# Patient Record
Sex: Female | Born: 2000 | Race: White | Hispanic: Yes | Marital: Single | Smoking: Never smoker
Health system: Southern US, Community
[De-identification: ages and names within clinical notes are randomized; demographics above are authoritative.]

## PROBLEM LIST (undated history)

## (undated) DIAGNOSIS — G43909 Migraine, unspecified, not intractable, without status migrainosus: Secondary | ICD-10-CM

## (undated) HISTORY — PX: TONSILLECTOMY: SUR1361

## (undated) HISTORY — DX: Migraine, unspecified, not intractable, without status migrainosus: G43.909

---
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
## 2019-06-19 ENCOUNTER — Other Ambulatory Visit: Payer: Self-pay

## 2019-06-19 ENCOUNTER — Ambulatory Visit (LOCAL_COMMUNITY_HEALTH_CENTER): Payer: Medicaid Other | Admitting: Nurse Practitioner

## 2019-06-19 VITALS — BP 102/69 | Ht 60.0 in | Wt 117.0 lb

## 2019-06-19 DIAGNOSIS — Z3009 Encounter for other general counseling and advice on contraception: Secondary | ICD-10-CM

## 2019-06-19 DIAGNOSIS — N760 Acute vaginitis: Secondary | ICD-10-CM | POA: Diagnosis not present

## 2019-06-19 DIAGNOSIS — B9689 Other specified bacterial agents as the cause of diseases classified elsewhere: Secondary | ICD-10-CM

## 2019-06-19 DIAGNOSIS — Z113 Encounter for screening for infections with a predominantly sexual mode of transmission: Secondary | ICD-10-CM

## 2019-06-19 DIAGNOSIS — Z30431 Encounter for routine checking of intrauterine contraceptive device: Secondary | ICD-10-CM

## 2019-06-19 LAB — WET PREP FOR TRICH, YEAST, CLUE
Trichomonas Exam: NEGATIVE
Yeast Exam: NEGATIVE

## 2019-06-19 MED ORDER — MULTIVITAMINS PO CAPS: 1.0000 | ORAL_CAPSULE | Freq: Every day | ORAL | 0 refills | Status: AC

## 2019-06-19 MED ORDER — METRONIDAZOLE 500 MG PO TABS: 500.0000 mg | ORAL_TABLET | Freq: Two times a day (BID) | ORAL | 0 refills | Status: AC

## 2019-06-19 NOTE — Progress Notes (Signed)
WH problem visit - I[facility] check and STD testing [facility]  Subjective:  Monique Riley is a 18 y.o. being seen today for   Chief Complaint  Patient presents with  . Gynecologic Exam    IUD String check and STD testing    Client into clinic for STD testing and IUD check Denies any additional significant medical history     Does the patient have a current or past history of drug use? No   No components found for: HCV]   Health Maintenance Due  Topic Date Due  . TETANUS/TDAP  06/17/2019  . INFLUENZA VACCINE  05/31/2019    Review of Systems  All other systems reviewed and are negative.   The following portions of the patient's history were reviewed and updated as appropriate: allergies, current medications, past family history, past medical history, past social history, past surgical history and problem list. Problem list updated.   See flowsheet for other program required questions.  Objective:   Vitals:   06/19/19 0924  BP: 102/69  Weight: 117 lb (53.1 kg)  Height: 5' (1.524 m)    Physical Exam Vitals signs reviewed.  Constitutional:      Appearance: Normal appearance. She is well-developed and normal weight.  Pulmonary:     Effort: Pulmonary effort is normal.  Genitourinary:    General: Normal vulva.     Exam position: Lithotomy position.     Vagina: Normal.     Cervix: Normal.     Uterus: Normal.      Comments: Client c/o slightly foul odor >4.5 ph Moderate amt of clear to white discharge noted  Visualized IUD strings and able to palpate with bimanual exam.  Skin:    General: Skin is warm and dry.  Neurological:     Mental Status: She is alert.  Psychiatric:        Behavior: Behavior is cooperative.       Assessment and Plan:  Monique Riley is a 18 y.o. female [facility] Women's Health problem visit  1. Encounter for routine  checking of intrauterine contraceptive device (IUD) Skyla IUD check - string visualized during cervical check and palpated with bimanual exam.  2. Screening examination for STD (sexually transmitted disease) Await results - WET PREP FOR TRICH, YEAST, CLUE - please treat wet mount per standing order 3. Bacterial vaginosis Please treat client for BV per standing order  - metroNIDAZOLE (FLAGYL) 500 MG tablet; Take 1 tablet (500 mg total) by mouth 2 (two) times daily for 7 days.  Dispense: 14 tablet; Refill: 0  4. Family planning services Plan to follow up for yearly physical exam  - Multiple Vitamin (MULTIVITAMIN) capsule; Take 1 capsule by mouth daily.  Dispense: 100 capsule; Refill: 0  Client asked the following questions for STD screening:  Allergies: NKDA Previous Surgeries: denies SMH - denies Medications - denies Contact to STD - denies Antibiotics in last 2 wks  - denies Last HIV test - unsure Prior STD - denies Immunizations UTD - unsure Last sex  - 06/09/2019 X last 2 wks  Partners Last 2 months  - 1 Type of sex - vaginal Smoke  - none ETOH - denies IVD  - denies MJ use - denies Travel in last 3 months - denies Abuse hx - denies LMP - 05/21/2019 Last pap - N/A Douche - denies G 0 P    BCM -  Skyla IUD      Return for PRN.  No future appointments.  Berniece Andreas, NP

## 2019-06-19 NOTE — Patient Instructions (Signed)

## 2019-06-19 NOTE — Progress Notes (Signed)
Here today for IUD string check and STD testing. Hal Morales, RN Wet prep results reviewed. Patient treated for BV per provider orders. Hal Morales, RN

## 2019-06-20 ENCOUNTER — Encounter: Payer: Self-pay | Admitting: Nurse Practitioner

## 2019-08-10 ENCOUNTER — Other Ambulatory Visit: Payer: Self-pay

## 2019-08-10 ENCOUNTER — Emergency Department
Admission: EM | Admit: 2019-08-10 | Discharge: 2019-08-10 | Disposition: A | Discharge status: Home / Self Care | Payer: Medicaid Other | Attending: Emergency Medicine | Admitting: Emergency Medicine

## 2019-08-10 ENCOUNTER — Emergency Department: Payer: Medicaid Other

## 2019-08-10 ENCOUNTER — Ambulatory Visit
Admission: EM | Admit: 2019-08-10 | Discharge: 2019-08-10 | Disposition: A | Discharge status: Home / Self Care | Payer: No Typology Code available for payment source | Source: Ambulatory Visit | Attending: Emergency Medicine | Admitting: Emergency Medicine

## 2019-08-10 DIAGNOSIS — S60221A Contusion of right hand, initial encounter: Secondary | ICD-10-CM | POA: Insufficient documentation

## 2019-08-10 DIAGNOSIS — Y9389 Activity, other specified: Secondary | ICD-10-CM | POA: Insufficient documentation

## 2019-08-10 DIAGNOSIS — Z0441 Encounter for examination and observation following alleged adult rape: Secondary | ICD-10-CM | POA: Diagnosis present

## 2019-08-10 DIAGNOSIS — Y999 Unspecified external cause status: Secondary | ICD-10-CM | POA: Insufficient documentation

## 2019-08-10 DIAGNOSIS — R519 Headache, unspecified: Secondary | ICD-10-CM | POA: Insufficient documentation

## 2019-08-10 DIAGNOSIS — Y929 Unspecified place or not applicable: Secondary | ICD-10-CM | POA: Insufficient documentation

## 2019-08-10 DIAGNOSIS — S60222A Contusion of left hand, initial encounter: Secondary | ICD-10-CM | POA: Insufficient documentation

## 2019-08-10 DIAGNOSIS — Z79899 Other long term (current) drug therapy: Secondary | ICD-10-CM | POA: Insufficient documentation

## 2019-08-10 DIAGNOSIS — Z3202 Encounter for pregnancy test, result negative: Secondary | ICD-10-CM | POA: Insufficient documentation

## 2019-08-10 LAB — URINALYSIS, COMPLETE (UACMP) WITH MICROSCOPIC
Bilirubin Urine: NEGATIVE
Glucose, UA: NEGATIVE mg/dL
Ketones, ur: 5 mg/dL — AB
Leukocytes,Ua: NEGATIVE
Nitrite: NEGATIVE
Protein, ur: 30 mg/dL — AB
Specific Gravity, Urine: 1.023 (ref 1.005–1.030)
Squamous Epithelial / LPF: NONE SEEN (ref 0–5)
pH: 6 (ref 5.0–8.0)

## 2019-08-10 LAB — POCT PREGNANCY, URINE: Preg Test, Ur: NEGATIVE

## 2019-08-10 MED ORDER — AZITHROMYCIN 500 MG PO TABS
ORAL_TABLET | ORAL | Status: AC
  Administered 2019-08-10: 1000 mg via ORAL
  Filled 2019-08-10: qty 2

## 2019-08-10 MED ORDER — LIDOCAINE HCL (PF) 1 % IJ SOLN
INTRAMUSCULAR | Status: AC
  Administered 2019-08-10: 0.9 mL
  Filled 2019-08-10: qty 5

## 2019-08-10 MED ORDER — PROMETHAZINE HCL 25 MG PO TABS
25.0000 mg | ORAL_TABLET | Freq: Four times a day (QID) | ORAL | Status: DC | PRN
  Administered 2019-08-10: 25 mg via ORAL
  Filled 2019-08-10: qty 1

## 2019-08-10 MED ORDER — LIDOCAINE HCL (PF) 1 % IJ SOLN
0.9000 mL | Freq: Once | INTRAMUSCULAR | Status: AC
  Administered 2019-08-10: 14:00:00 0.9 mL

## 2019-08-10 MED ORDER — CEFTRIAXONE SODIUM 250 MG IJ SOLR
250.0000 mg | Freq: Once | INTRAMUSCULAR | Status: AC
  Administered 2019-08-10: 14:00:00 250 mg via INTRAMUSCULAR
  Filled 2019-08-10: qty 250

## 2019-08-10 MED ORDER — IBUPROFEN 600 MG PO TABS
600.0000 mg | ORAL_TABLET | Freq: Once | ORAL | Status: AC
  Administered 2019-08-10: 600 mg via ORAL
  Filled 2019-08-10: qty 1

## 2019-08-10 MED ORDER — AZITHROMYCIN 500 MG PO TABS
1000.0000 mg | ORAL_TABLET | Freq: Once | ORAL | Status: AC
  Administered 2019-08-10: 14:00:00 1000 mg via ORAL

## 2019-08-10 MED ORDER — METRONIDAZOLE 500 MG PO TABS
2000.0000 mg | ORAL_TABLET | Freq: Once | ORAL | Status: AC
  Administered 2019-08-10: 2000 mg via ORAL
  Filled 2019-08-10: qty 4
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
## 2019-08-10 NOTE — ED Notes (Signed)
EDP and this RN in room to examine patient, Dr. Archie Balboa discussed with patient about SANE coming to see her and take some samples and get XR of hands.
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
## 2019-08-10 NOTE — ED Notes (Signed)
Called Sane  Dr. Archie Balboa spoke to Bell  [phone]

## 2019-08-10 NOTE — ED Notes (Signed)
Printed and had pt sign for discharge d/t signature pad in room not working

## 2019-08-10 NOTE — ED Notes (Signed)
AC SD in room at this time speaking with patient.

## 2019-08-10 NOTE — Discharge Instructions (Signed)
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 to your pediatrician regarding the use of this medicine in children. Special care may be needed. This medicine should not be given to infants and children younger than 48 years old. Overdosage: If you think you have taken too much of this medicine contact a poison control center or emergency room at once. NOTE: This medicine is only for you. Do not share this medicine with others. What if I miss a dose? If you miss a dose, take it as soon as you can. If it is almost time for your next dose, take only that dose. Do not take double or extra doses. What may interact with this medicine? Do not take this medicine with any of the following  medications:  cisapride  dronedarone  MAOIs like Carbex, Eldepryl, Marplan, Nardil, Parnate  pimozide  quinidine, including dextromethorphan; quinidine  thioridazine This medicine may also interact with the following medications:  certain medicines for depression, anxiety, or psychotic disturbances  certain medicines for anxiety or sleep  certain medicines for seizures like carbamazepine, phenobarbital, phenytoin  certain medicines for movement abnormalities as in Parkinson's disease, or for gastrointestinal problems  epinephrine  medicines for allergies or colds  muscle relaxants  narcotic medicines for pain  other medicines that prolong the QT interval (cause an abnormal heart rhythm) like dofetilide, ziprasidone  tramadol  trimethobenzamide This list may not describe all possible interactions. Give your health care provider a list of all the medicines, herbs, non-prescription drugs, or dietary supplements you use. Also tell them if you smoke, drink alcohol, or use illegal drugs. Some items may interact with your medicine. What should I watch for while using this medicine? Tell your doctor or health care professional if your symptoms do not start to get better in 1 to 2 days. You may get drowsy or dizzy. Do not drive, use machinery, or do anything that needs mental alertness until you know how this medicine affects you. To reduce the risk of dizzy or fainting spells, do not stand or sit up quickly, especially if you are an older patient. Alcohol may increase dizziness and drowsiness. Avoid alcoholic drinks. Your mouth may get dry. Chewing sugarless gum or sucking hard candy, and drinking plenty of water may help. Contact your doctor if the problem does not go away or is severe. This medicine may cause dry eyes and blurred vision. If you wear contact lenses you may feel some discomfort. Lubricating drops may help. See your eye doctor if the problem does not go away or is  severe. This medicine can make you more sensitive to the sun. Keep out of the sun. If you cannot avoid being in the sun, wear protective clothing and use sunscreen. Do not use sun lamps or tanning beds/booths. If you are diabetic, check your blood-sugar levels regularly. What side effects may I notice from receiving this medicine? Side effects that you should report to your doctor or health care professional as soon as possible:  blurred vision  irregular heartbeat, palpitations or chest pain  muscle or facial twitches  pain or difficulty passing urine  seizures  skin rash  slowed or shallow breathing  unusual bleeding or bruising  yellowing of the eyes or skin Side effects that usually do not require medical attention (report to your doctor or health care professional if they continue or are bothersome):  headache  nightmares, agitation, nervousness, excitability, not able to sleep (these are more likely  in children)  stuffy nose This list may not describe all possible side effects. Call your doctor for medical advice about side effects. You may report side effects to FDA at 1-800-FDA-1088. Where should I keep my medicine? Keep out of the reach of children. Store at room temperature, between 20 and 25 degrees C (68 and 77 degrees F). Protect from light. Throw away any unused medicine after the expiration date. NOTE: This sheet is a summary. It may not cover all possible information. If you have questions about this medicine, talk to your doctor, pharmacist, or health care provider.  2020 Elsevier/Gold Standard (2018-10-08 08:46:17)
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
## 2019-08-10 NOTE — ED Notes (Signed)
Medications pulled for SANE nurse

## 2019-08-10 NOTE — ED Notes (Signed)
Pt refusing covid swab.  MD aware
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
## 2019-08-10 NOTE — ED Notes (Signed)
Pt given cup of water, informed her SANE is enroute to talk with her.
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
## 2019-08-10 NOTE — ED Notes (Signed)
Boyfriend back at bedside.
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
## 2019-09-11 ENCOUNTER — Encounter: Payer: Self-pay | Admitting: Advanced Practice Midwife

## 2019-09-11 ENCOUNTER — Ambulatory Visit: Payer: Medicaid Other | Admitting: Advanced Practice Midwife

## 2019-09-11 ENCOUNTER — Other Ambulatory Visit: Payer: Self-pay

## 2019-09-11 DIAGNOSIS — Z113 Encounter for screening for infections with a predominantly sexual mode of transmission: Secondary | ICD-10-CM

## 2019-09-11 DIAGNOSIS — Z62810 Personal history of physical and sexual abuse in childhood: Secondary | ICD-10-CM

## 2019-09-11 DIAGNOSIS — T7421XD Adult sexual abuse, confirmed, subsequent encounter: Secondary | ICD-10-CM

## 2019-09-11 DIAGNOSIS — T7421XA Adult sexual abuse, confirmed, initial encounter: Secondary | ICD-10-CM | POA: Insufficient documentation

## 2019-09-11 LAB — WET PREP FOR TRICH, YEAST, CLUE
Trichomonas Exam: NEGATIVE
Yeast Exam: NEGATIVE

## 2019-09-11 NOTE — Progress Notes (Signed)
Wet mount results reviewed with provider. No treatment needed today.Jenetta Downer, RN
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
## 2020-07-22 ENCOUNTER — Encounter: Payer: Self-pay | Admitting: Advanced Practice Midwife

## 2020-07-22 ENCOUNTER — Other Ambulatory Visit: Payer: Self-pay

## 2020-07-22 ENCOUNTER — Ambulatory Visit (LOCAL_COMMUNITY_HEALTH_CENTER): Payer: Medicaid Other | Admitting: Advanced Practice Midwife

## 2020-07-22 VITALS — BP 111/69 | Ht 60.0 in | Wt 115.0 lb

## 2020-07-22 DIAGNOSIS — F4310 Post-traumatic stress disorder, unspecified: Secondary | ICD-10-CM | POA: Diagnosis not present

## 2020-07-22 DIAGNOSIS — B9689 Other specified bacterial agents as the cause of diseases classified elsewhere: Secondary | ICD-10-CM

## 2020-07-22 DIAGNOSIS — Z3009 Encounter for other general counseling and advice on contraception: Secondary | ICD-10-CM

## 2020-07-22 DIAGNOSIS — F329 Major depressive disorder, single episode, unspecified: Secondary | ICD-10-CM

## 2020-07-22 DIAGNOSIS — N760 Acute vaginitis: Secondary | ICD-10-CM

## 2020-07-22 DIAGNOSIS — F32A Depression, unspecified: Secondary | ICD-10-CM | POA: Insufficient documentation

## 2020-07-22 DIAGNOSIS — Z30431 Encounter for routine checking of intrauterine contraceptive device: Secondary | ICD-10-CM | POA: Diagnosis not present

## 2020-07-22 LAB — WET PREP FOR TRICH, YEAST, CLUE
Trichomonas Exam: NEGATIVE
Yeast Exam: NEGATIVE

## 2020-07-22 MED ORDER — METRONIDAZOLE 500 MG PO TABS: 500.0000 mg | ORAL_TABLET | Freq: Two times a day (BID) | ORAL | 0 refills | Status: AC

## 2020-07-22 NOTE — Progress Notes (Signed)
Pt is here for physical. Pt has a Skyla IUD. Pt reports has just noticed some sharp pain that comes in goes in her lower abdomen a couple of times a month, but is satisfied with the IUD as her BCM. Pt reports having really long periods with spotting afterwards. Pt reports LMP started 07/07/2020 with consistent spotting since then. Pt desires STD screening as she has had some vaginal discharge. Pt filling out PHQ9.
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
## 2020-07-23 NOTE — Progress Notes (Signed)
Wet mount reviewed and pt treated for BV per standing order and per Hazle Coca, CNM verbal order. Kathreen Cosier, LCSW and [facility] cards with contact info given to pt per pt request and per provider order. Counseled pt per provider orders and pt states understanding. Provider orders completed.

## 2021-01-07 ENCOUNTER — Ambulatory Visit (LOCAL_COMMUNITY_HEALTH_CENTER): Payer: Medicaid Other | Admitting: Family Medicine

## 2021-01-07 ENCOUNTER — Ambulatory Visit: Payer: Medicaid Other

## 2021-01-07 ENCOUNTER — Other Ambulatory Visit: Payer: Self-pay

## 2021-01-07 ENCOUNTER — Encounter: Payer: Self-pay | Admitting: Family Medicine

## 2021-01-07 VITALS — BP 111/65 | HR 69 | Ht 60.0 in | Wt 120.6 lb

## 2021-01-07 DIAGNOSIS — Z3009 Encounter for other general counseling and advice on contraception: Secondary | ICD-10-CM | POA: Diagnosis not present

## 2021-01-07 DIAGNOSIS — B9689 Other specified bacterial agents as the cause of diseases classified elsewhere: Secondary | ICD-10-CM

## 2021-01-07 DIAGNOSIS — Z01419 Encounter for gynecological examination (general) (routine) without abnormal findings: Secondary | ICD-10-CM | POA: Diagnosis not present

## 2021-01-07 DIAGNOSIS — Z113 Encounter for screening for infections with a predominantly sexual mode of transmission: Secondary | ICD-10-CM

## 2021-01-07 LAB — WET PREP FOR TRICH, YEAST, CLUE
Trichomonas Exam: NEGATIVE
Yeast Exam: NEGATIVE

## 2021-01-07 MED ORDER — METRONIDAZOLE 500 MG PO TABS: 500.0000 mg | ORAL_TABLET | Freq: Two times a day (BID) | ORAL | 0 refills | Status: AC

## 2021-01-07 NOTE — Progress Notes (Signed)
Presents for evaluation of lower abd pain with persistent vaginal bleeding. Reports above began beginning of 2022. Reports long menses in 2021, but would be a break in between bleeding. Declined Covid vaccine. Jossie Ng, RN Wet mount reviewed by Elveria Rising FNP-BC and treated per her order. Jossie Ng, RN

## 2021-01-07 NOTE — Progress Notes (Signed)
   [facility] problem visit  [facility]  Subjective:  Monique Riley is a 20 y.o. being seen today for abdominal/ IUD pain.    Chief Complaint  Patient presents with  . IUD evaluation    HPI   Does the patient have a current or past history of drug use? No   No components found for: HCV]   Health Maintenance Due  Topic Date Due  . Hepatitis C Screening  Never done  . COVID-19 Vaccine (1) Never done  . HPV VACCINES (1 - 2-dose series) Never done  . CHLAMYDIA SCREENING  Never done  . TETANUS/TDAP  Never done  . INFLUENZA VACCINE  Never done    ROS  The following portions of the patient's history were reviewed and updated as appropriate: allergies, current medications, past family history, past medical history, past social history, past surgical history and problem list. Problem list updated.   See flowsheet for other program required questions.  Objective:   Vitals:   01/07/21 1423  BP: 111/65  Pulse: 69  Weight: 120 lb 9.6 oz (54.7 kg)  Height: 5' (1.524 m)    Physical Exam Constitutional:      Appearance: Normal appearance.  Pulmonary:     Effort: Pulmonary effort is normal.  Genitourinary:    General: Normal vulva.     Rectum: Normal.     Comments: External genitalia without, lice, nits, erythema, edema , lesions or inguinal adenopathy. Vagina with normal mucosa and discharge and pH > 4.  Cervix without visual lesions, uterus firm, mobile, slightly tender on anterior palpation, no masses, CMT adnexal fullness or tenderness.   IUD strings visualized.   Skin:    General: Skin is warm and dry.  Neurological:     Mental Status: She is alert and oriented to person, place, and time.  Psychiatric:        Mood and Affect: Mood normal.        Behavior: Behavior normal.       Assessment and Plan:  Monique Riley is a 20 y.o. female presenting to the [facility] Department for a Women's Health  problem visit  1. Visit for pelvic exam Patient in clinic today reporting having lower abdominal pain x 2 weeks with intermittent vaginal bleeding.  Patient IUD was placed 2 years ago.  Last sex was 11/15/2020.  Reports that partner is only partner.   Patient is considering removal of IUD d/t mother telling patient that IUD was embedded.  Discussed with patient after pelvic exam, testing for infection and trying round of Ibuprofen 800 mg TID x 5 days and re-evaluate need for IUD removal.    2. Screening examination for venereal disease Patient screened for GC/CT and wet prep to rule out infection.  - Chlamydia/Gonorrhea [facility] - WET PREP FOR TRICH, YEAST, CLUE  3. BV (bacterial vaginosis) Wet prep + for amine & clue.  Instructional information for BV prevention given.    RN: treat patient for + BV  - metroNIDAZOLE (FLAGYL) 500 MG tablet; Take 1 tablet (500 mg total) by mouth 2 (two) times daily for 7 days.  Dispense: 14 tablet; Refill: 0     Return in about 10 days (around 01/17/2021) for as needed.  No future appointments.  Wendi Snipes, FNP

## 2021-01-07 NOTE — Patient Instructions (Signed)
Bacterial Vaginosis  Bacterial vaginosis is an infection that occurs when the normal balance of bacteria in the vagina changes. This change is caused by an overgrowth of certain bacteria in the vagina. Bacterial vaginosis is the most common vaginal infection among females aged 21 to 44 years. This condition increases the risk of sexually transmitted infections (STIs). Treatment can help reduce this risk. Treatment is very important for pregnant women because this condition can cause babies to be born early (prematurely) or at a low birth weight. What are the causes? This condition is caused by an increase in harmful bacteria that are normally present in small amounts in the vagina. However, the exact reason this condition develops is not known. You cannot get bacterial vaginosis from toilet seats, bedding, swimming pools, or contact with objects around you. What increases the risk? The following factors may make you more likely to develop this condition:  Having a new sexual partner or multiple sexual partners, or having unprotected sex.  Douching.  Having an intrauterine device (IUD).  Smoking.  Abusing drugs and alcohol. This may lead to riskier sexual behavior.  Taking certain antibiotic medicines.  Being pregnant. What are the signs or symptoms? Some women with this condition have no symptoms. Symptoms may include:  Gray or white vaginal discharge. The discharge can be watery or foamy.  A fish-like odor with discharge, especially after sex or during menstruation.  Itching in and around the vagina.  Burning or pain with urination. How is this diagnosed? This condition is diagnosed based on:  Your medical history.  A physical exam of the vagina.  Checking a sample of vaginal fluid for harmful bacteria or abnormal cells. How is this treated? This condition is treated with antibiotic medicines. These may be given as a pill, a vaginal cream, or a medicine that is put into the  vagina (suppository). If the condition comes back after treatment, a second round of antibiotics may be needed. Follow these instructions at home: Medicines  Take or apply over-the-counter and prescription medicines only as told by your health care provider.  Take or apply your antibiotic medicine as told by your health care provider. Do not stop using the antibiotic even if you start to feel better. General instructions  If you have a female sexual partner, tell her that you have a vaginal infection. She should follow up with her health care provider. If you have a female sexual partner, he does not need treatment.  Avoid sexual activity until you finish treatment.  Drink enough fluid to keep your urine pale yellow.  Keep the area around your vagina and rectum clean. ? Wash the area daily with warm water. ? Wipe yourself from front to back after using the toilet.  If you are breastfeeding, talk to your health care provider about continuing breastfeeding during treatment.  Keep all follow-up visits. This is important. How is this prevented? Self-care  Do not douche.  Wash the outside of your vagina with warm water only.  Wear cotton or cotton-lined underwear.  Avoid wearing tight pants and pantyhose, especially during the summer. Safe sex  Use protection when having sex. This includes: ? Using condoms. ? Using dental dams. This is a thin layer of a material made of latex or polyurethane that protects the mouth during oral sex.  Limit the number of sexual partners. To help prevent bacterial vaginosis, it is best to have sex with just one partner (monogamous relationship).  Make sure you and your sexual partner   are tested for STIs. Drugs and alcohol  Do not use any products that contain nicotine or tobacco. These products include cigarettes, chewing tobacco, and vaping devices, such as e-cigarettes. If you need help quitting, ask your health care provider.  Do not use  drugs.  Do not drink alcohol if: ? Your health care provider tells you not to do this. ? You are pregnant, may be pregnant, or are planning to become pregnant.  If you drink alcohol: ? Limit how much you have to 0-1 drink a day. ? Be aware of how much alcohol is in your drink. In the [place] one drink equals one 12 oz bottle of beer (355 mL), one 5 oz glass of wine (148 mL), or one 1 oz glass of hard liquor (44 mL). Where to find more information  Centers for Disease Control and Prevention: www.cdc.gov  American Sexual Health Association (ASHA): www.ashastd.org  [place] Department of Health and Human Services, Office on Women's Health: www.womenshealth.gov Contact a health care provider if:  Your symptoms do not improve, even after treatment.  You have more discharge or pain when urinating.  You have a fever or chills.  You have pain in your abdomen or pelvis.  You have pain during sex.  You have vaginal bleeding between menstrual periods. Summary  Bacterial vaginosis is a vaginal infection that occurs when the normal balance of bacteria in the vagina changes. It results from an overgrowth of certain bacteria.  This condition increases the risk of sexually transmitted infections (STIs). Getting treated can help reduce this risk.  Treatment is very important for pregnant women because this condition can cause babies to be born early (prematurely) or at low birth weight.  This condition is treated with antibiotic medicines. These may be given as a pill, a vaginal cream, or a medicine that is put into the vagina (suppository). This information is not intended to replace advice given to you by your health care provider. Make sure you discuss any questions you have with your health care provider. Document Revised: 04/15/2020 Document Reviewed: 04/15/2020 Elsevier Patient Education  2021 Elsevier Inc.  

## 2022-06-15 ENCOUNTER — Ambulatory Visit (LOCAL_COMMUNITY_HEALTH_CENTER): Payer: Medicaid Other | Admitting: Advanced Practice Midwife

## 2022-06-15 ENCOUNTER — Encounter: Payer: Self-pay | Admitting: Advanced Practice Midwife

## 2022-06-15 ENCOUNTER — Ambulatory Visit: Payer: Medicaid Other | Admitting: Advanced Practice Midwife

## 2022-06-15 VITALS — BP 111/71 | Ht 60.0 in | Wt 124.4 lb

## 2022-06-15 DIAGNOSIS — Z3009 Encounter for other general counseling and advice on contraception: Secondary | ICD-10-CM

## 2022-06-15 DIAGNOSIS — Z30431 Encounter for routine checking of intrauterine contraceptive device: Secondary | ICD-10-CM | POA: Diagnosis not present

## 2022-06-15 DIAGNOSIS — Z113 Encounter for screening for infections with a predominantly sexual mode of transmission: Secondary | ICD-10-CM

## 2022-06-15 DIAGNOSIS — Z309 Encounter for contraceptive management, unspecified: Secondary | ICD-10-CM

## 2022-06-15 LAB — WET PREP FOR TRICH, YEAST, CLUE
Trichomonas Exam: NEGATIVE
Yeast Exam: NEGATIVE

## 2022-06-15 NOTE — Progress Notes (Addendum)
WET PREP negative and no treatment indicated today in clinic. Pt verbalized understanding of additional labwork pending.  Lethea Killings RN See other encounter dated 06/15/22.
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
## 2022-06-16 NOTE — Progress Notes (Signed)
Pt here for PE and Pap smear.  Wet mount results reviewed, no treatment required per SO.  FP packet given.  Berdie Ogren, RN

## 2022-06-20 LAB — PAP IG (IMAGE GUIDED): PAP Smear Comment: 0
